# Patient Record
Sex: Male | Born: 1995 | Race: White | Hispanic: No | Marital: Single | Smoking: Current every day smoker
Health system: Southern US, Community
[De-identification: ages and names within clinical notes are randomized; demographics above are authoritative.]

## PROBLEM LIST (undated history)

## (undated) DIAGNOSIS — E079 Disorder of thyroid, unspecified: Secondary | ICD-10-CM

---

## 2017-03-06 ENCOUNTER — Emergency Department (HOSPITAL_BASED_OUTPATIENT_CLINIC_OR_DEPARTMENT_OTHER): Payer: No Typology Code available for payment source

## 2017-03-06 ENCOUNTER — Encounter (HOSPITAL_BASED_OUTPATIENT_CLINIC_OR_DEPARTMENT_OTHER): Payer: Self-pay

## 2017-03-06 ENCOUNTER — Emergency Department (HOSPITAL_BASED_OUTPATIENT_CLINIC_OR_DEPARTMENT_OTHER)
Admission: EM | Admit: 2017-03-06 | Discharge: 2017-03-06 | Disposition: A | Discharge status: Home / Self Care | Payer: No Typology Code available for payment source | Attending: Emergency Medicine | Admitting: Emergency Medicine

## 2017-03-06 DIAGNOSIS — F17290 Nicotine dependence, other tobacco product, uncomplicated: Secondary | ICD-10-CM | POA: Insufficient documentation

## 2017-03-06 DIAGNOSIS — Y999 Unspecified external cause status: Secondary | ICD-10-CM | POA: Insufficient documentation

## 2017-03-06 DIAGNOSIS — S8991XA Unspecified injury of right lower leg, initial encounter: Secondary | ICD-10-CM | POA: Diagnosis present

## 2017-03-06 DIAGNOSIS — Y929 Unspecified place or not applicable: Secondary | ICD-10-CM | POA: Diagnosis not present

## 2017-03-06 DIAGNOSIS — Z23 Encounter for immunization: Secondary | ICD-10-CM | POA: Diagnosis not present

## 2017-03-06 DIAGNOSIS — Y939 Activity, unspecified: Secondary | ICD-10-CM | POA: Insufficient documentation

## 2017-03-06 DIAGNOSIS — S81011A Laceration without foreign body, right knee, initial encounter: Secondary | ICD-10-CM | POA: Diagnosis not present

## 2017-03-06 HISTORY — DX: Disorder of thyroid, unspecified: E07.9

## 2017-03-06 MED ORDER — LIDOCAINE HCL (PF) 1 % IJ SOLN: 10.0000 mL | Freq: Once | INTRAMUSCULAR | Status: DC

## 2017-03-06 MED ORDER — LIDOCAINE-EPINEPHRINE-TETRACAINE (LET) SOLUTION
3.0000 mL | Freq: Once | NASAL | Status: AC
  Administered 2017-03-06: 14:00:00 3 mL via TOPICAL
  Filled 2017-03-06: qty 3

## 2017-03-06 MED ORDER — TETANUS-DIPHTH-ACELL PERTUSSIS 5-2.5-18.5 LF-MCG/0.5 IM SUSP
0.5000 mL | Freq: Once | INTRAMUSCULAR | Status: AC
  Administered 2017-03-06: 0.5 mL via INTRAMUSCULAR
  Filled 2017-03-06: qty 0.5

## 2017-03-06 MED ORDER — LIDOCAINE HCL 1 % IJ SOLN
INTRAMUSCULAR | Status: AC
  Administered 2017-03-06: 10 mL
  Filled 2017-03-06: qty 10
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
## 2017-03-06 NOTE — Discharge Instructions (Signed)
Keep wound dry and do not remove dressing for 24 hours if possible. After that, wash gently morning and night (every 12 hours) with soap and water. Use a topical antibiotic ointment and cover with a bandaid or gauze.    Do NOT use rubbing alcohol or hydrogen peroxide, do not soak the area.    Present to your primary care doctor or the urgent care of your choice, or the ED for suture removal in 10-12 days.   Every attempt was made to remove foreign body (contaminants) from the wound.  However, there is always a chance that some may remain in the wound. This can  increase your risk of infection.   If you see signs of infection (warmth, redness, tenderness, pus, sharp increase in pain, fever, red streaking in the skin) immediately return to the emergency department.   After the wound heals fully, apply sunscreen for 6-12 months to minimize scarring.

## 2017-03-06 NOTE — ED Notes (Signed)
Patient transported to X-ray 
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
